# Patient Record
Sex: Female | Born: 1985 | Race: White | Hispanic: No | Marital: Single | Smoking: Former smoker
Health system: Southern US, Community
[De-identification: ages and names within clinical notes are randomized; demographics above are authoritative.]

## PROBLEM LIST (undated history)

## (undated) ENCOUNTER — Ambulatory Visit: Admission: EM | Payer: Self-pay | Source: Home / Self Care

## (undated) DIAGNOSIS — B003 Herpesviral meningitis: Secondary | ICD-10-CM

## (undated) DIAGNOSIS — I10 Essential (primary) hypertension: Secondary | ICD-10-CM

## (undated) DIAGNOSIS — G032 Benign recurrent meningitis [Mollaret]: Secondary | ICD-10-CM

## (undated) DIAGNOSIS — G039 Meningitis, unspecified: Secondary | ICD-10-CM

## (undated) HISTORY — PX: CHOLECYSTECTOMY: SHX55

## (undated) HISTORY — DX: Benign recurrent meningitis (mollaret): G03.2

---
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
## 2005-12-19 ENCOUNTER — Emergency Department: Payer: Self-pay

## 2006-01-02 ENCOUNTER — Emergency Department: Payer: Self-pay | Admitting: Emergency Medicine

## 2006-08-12 ENCOUNTER — Emergency Department: Payer: Self-pay | Admitting: Emergency Medicine

## 2006-08-30 ENCOUNTER — Emergency Department: Payer: Self-pay | Admitting: Unknown Physician Specialty

## 2007-03-30 ENCOUNTER — Emergency Department: Payer: Self-pay | Admitting: Unknown Physician Specialty

## 2007-07-23 ENCOUNTER — Emergency Department: Payer: Self-pay | Admitting: Emergency Medicine

## 2009-02-17 ENCOUNTER — Emergency Department: Payer: Self-pay | Admitting: Emergency Medicine

## 2009-03-04 ENCOUNTER — Emergency Department: Payer: Self-pay | Admitting: Internal Medicine

## 2010-02-26 ENCOUNTER — Emergency Department: Payer: Self-pay | Admitting: Emergency Medicine

## 2010-02-28 ENCOUNTER — Inpatient Hospital Stay: Payer: Self-pay | Admitting: Internal Medicine

## 2010-03-20 ENCOUNTER — Emergency Department: Payer: Self-pay | Admitting: Emergency Medicine

## 2010-05-21 ENCOUNTER — Emergency Department: Payer: Self-pay | Admitting: Emergency Medicine

## 2010-07-03 ENCOUNTER — Emergency Department: Payer: Self-pay | Admitting: Emergency Medicine

## 2011-07-16 IMAGING — CR LEFT WRIST - COMPLETE 3+ VIEW
1 series · 4 of 4 positions shown · non-contrast
Comparison: none

REASON FOR EXAM: injury/pain/swelling..pt in WR
COMMENTS:   May transport without cardiac monitor

PROCEDURE:     DXR - DXR WRIST LT COMP WITH OBLIQUES  - May 22, 2010  [DATE]
RESULT:     Four views of the left wrist are submitted.
The bones appear adequately mineralized. I do not see evidence of an acute
fracture. The overlying soft tissues are grossly normal.

[Series 1: view not recorded · 0.17mm/px · 4 of 4 slices shown]
[im 1/4]
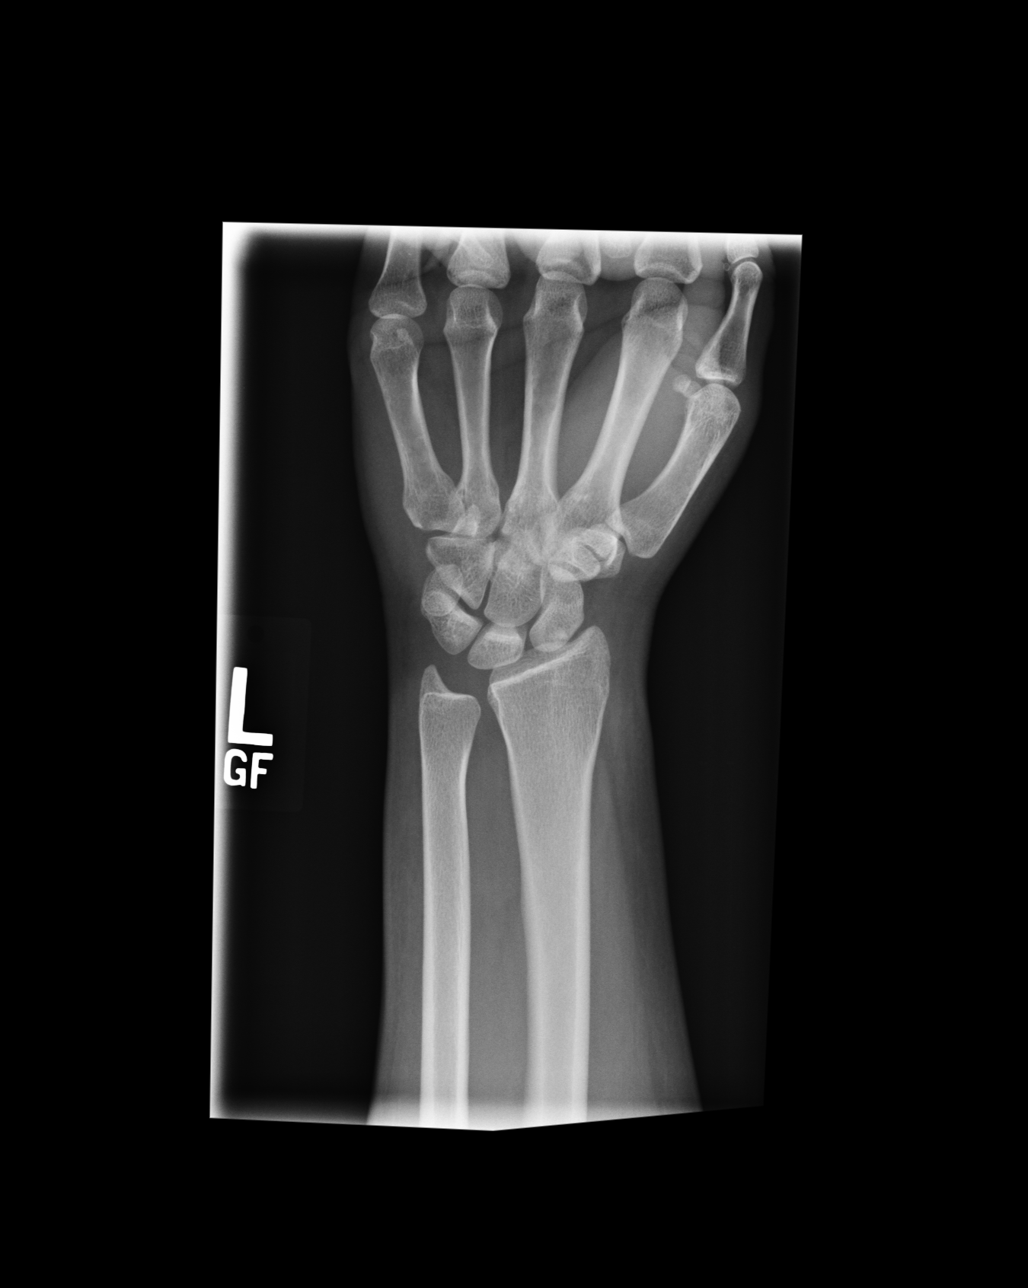
[im 2/4]
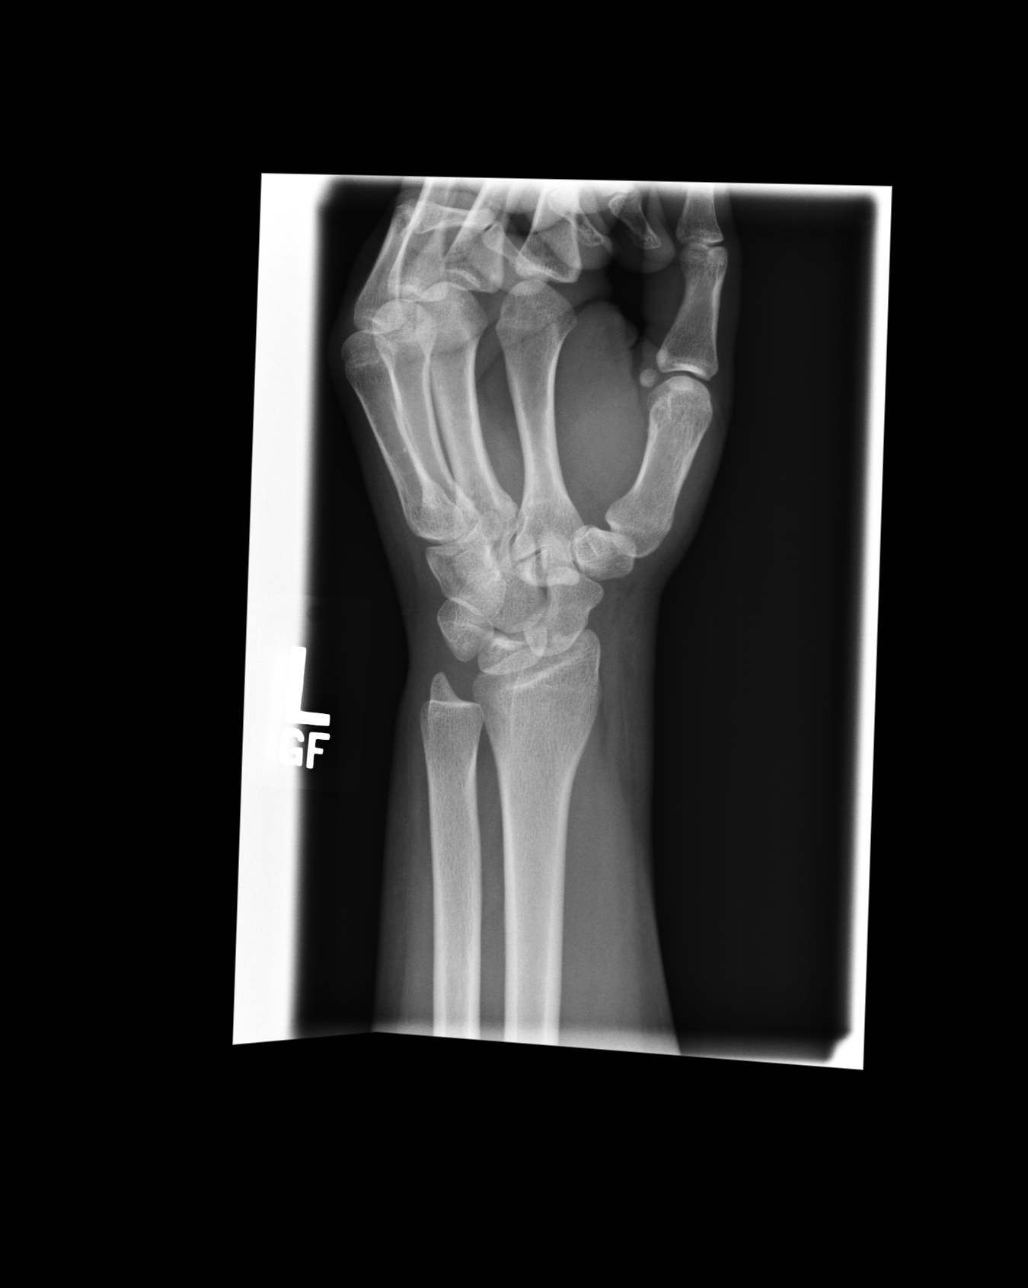
[im 3/4]
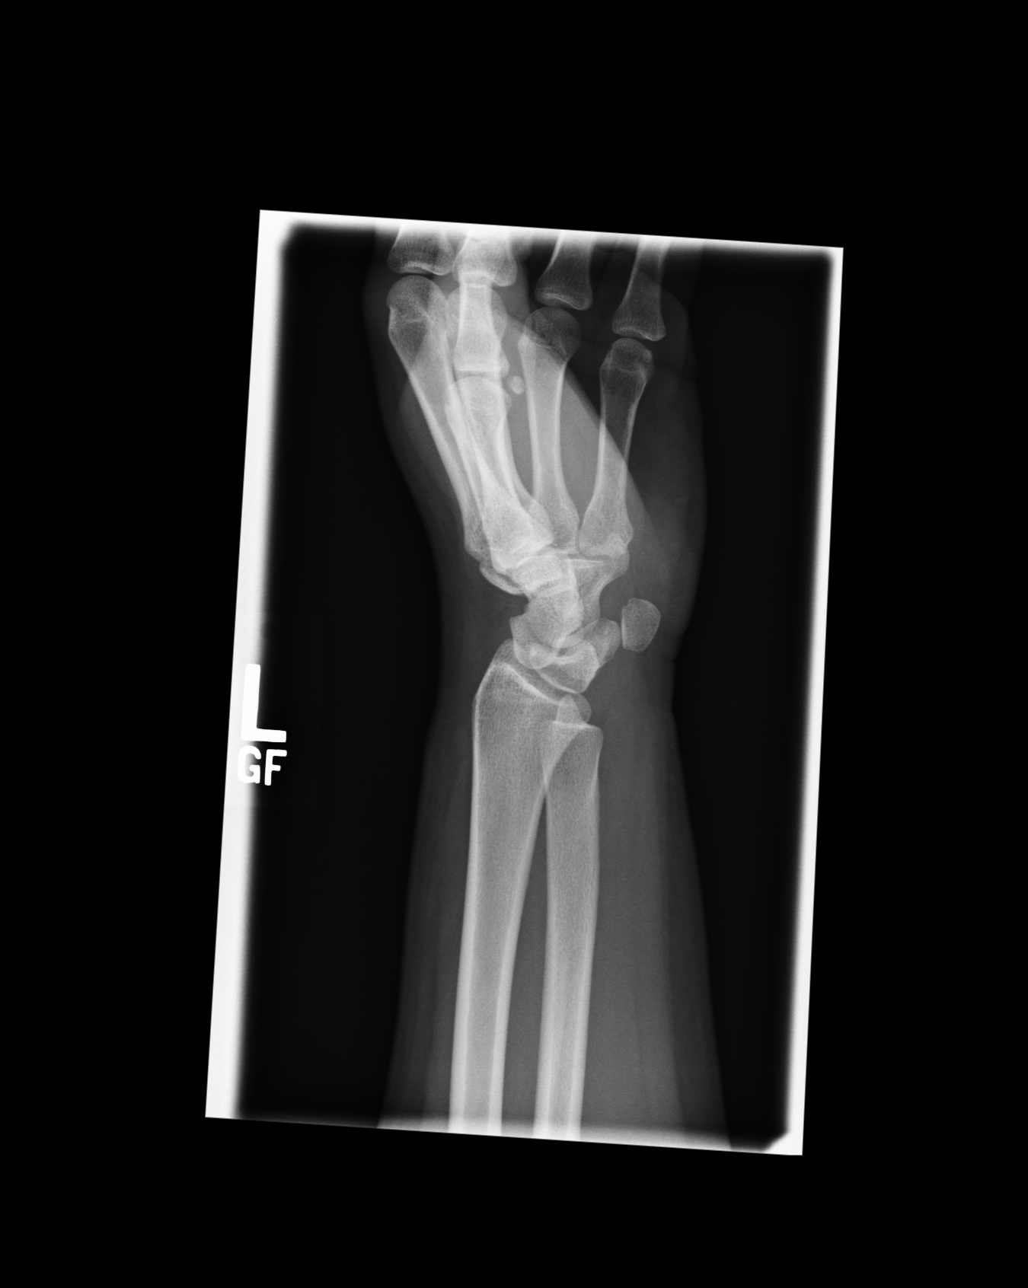
[im 4/4]
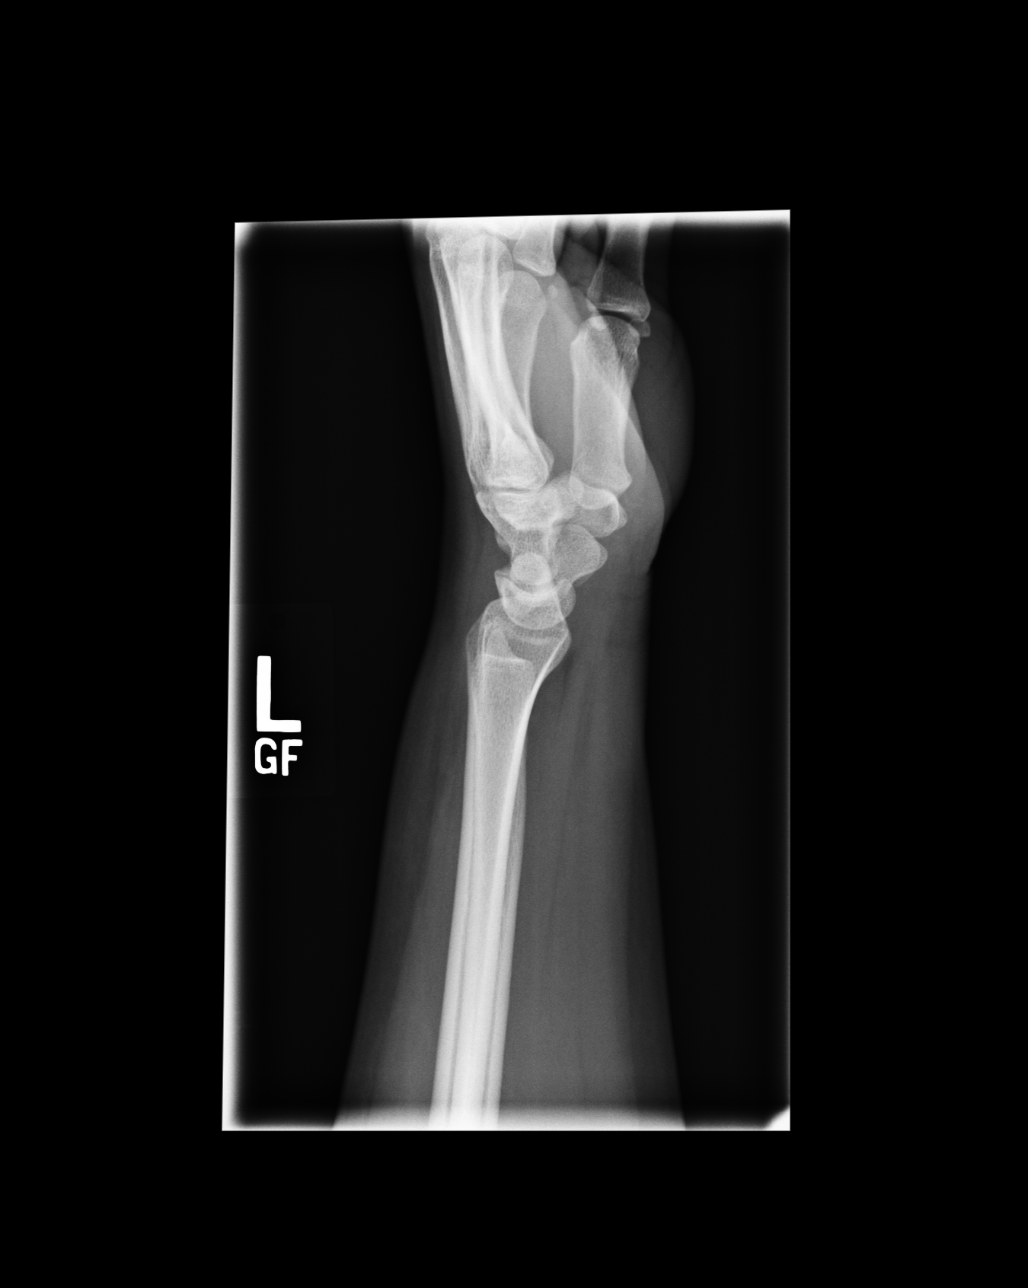

[4 of 4 positions shown; findings below may reference images not displayed]

IMPRESSION: I see no acute bony abnormality of the left wrist.

## 2015-04-21 DIAGNOSIS — R519 Headache, unspecified: Secondary | ICD-10-CM | POA: Insufficient documentation

## 2015-04-22 DIAGNOSIS — R112 Nausea with vomiting, unspecified: Secondary | ICD-10-CM | POA: Insufficient documentation

## 2015-04-22 DIAGNOSIS — B003 Herpesviral meningitis: Secondary | ICD-10-CM | POA: Insufficient documentation

## 2015-07-01 ENCOUNTER — Ambulatory Visit: Payer: Self-pay | Admitting: Physician Assistant

## 2015-07-01 ENCOUNTER — Encounter: Payer: Self-pay | Admitting: Physician Assistant

## 2015-07-01 VITALS — BP 110/82 | HR 100 | Temp 101.0°F

## 2015-07-01 DIAGNOSIS — R110 Nausea: Secondary | ICD-10-CM

## 2015-07-01 DIAGNOSIS — R1110 Vomiting, unspecified: Secondary | ICD-10-CM

## 2015-07-01 DIAGNOSIS — R509 Fever, unspecified: Secondary | ICD-10-CM

## 2015-07-01 LAB — POCT INFLUENZA A/B
INFLUENZA A, POC: NEGATIVE
Influenza B, POC: NEGATIVE

## 2015-07-01 LAB — POCT URINE PREGNANCY: Preg Test, Ur: NEGATIVE

## 2015-07-01 LAB — GLUCOSE, POCT (MANUAL RESULT ENTRY): POC GLUCOSE: 115 mg/dL — AB (ref 70–99)

## 2015-07-01 MED ORDER — PROMETHAZINE HCL 25 MG PO TABS: 25.0000 mg | ORAL_TABLET | Freq: Three times a day (TID) | ORAL | Status: DC | PRN

## 2015-07-01 NOTE — Progress Notes (Signed)
S: at work this morning, got dizzy, felt weak, slid down the wall and ?passed out, did have vomiting this am and thinks she has a fever, fsbs 115, doesn't think she's preg, her coworker states she was "out" for a few seconds, pt doesn't think she passed out, is feeling a little better now but thinks her temp has gotten higher  O: vitals wnl except temp elevated at 99, on recheck it is 101;  nad, perrl eomi, ENT wnl, neck supple no lymph, lungs c t a, cv rrr, neuro intact, ua preg neg, flu swab neg, fsbs 115  A: acute viral illness  P: phenergan, fluids, no work until has not had a fever in 24-48 hours; if worsening return to clinic or go to ER

## 2015-07-01 NOTE — Progress Notes (Signed)
Glucose test Lot # [id number] Exp 09/2016 and  HCG test lot # [id number] exp 09/2016  And Influenza test [id number]# [id number]  Exp 07/2016

## 2015-07-01 NOTE — Addendum Note (Signed)
Addended by: Versie Starks on: 07/01/2015 11:21 AM   Modules accepted: Orders

## 2015-07-01 NOTE — Patient Instructions (Signed)
Nausea and Vomiting  Nausea means you feel sick to your stomach. Throwing up (vomiting) is a reflex where stomach contents come out of your mouth.  HOME CARE   · Take medicine as told by your doctor.  · Do not force yourself to eat. However, you do need to drink fluids.  · If you feel like eating, eat a normal diet as told by your doctor.    Eat rice, wheat, potatoes, bread, lean meats, yogurt, fruits, and vegetables.    Avoid high-fat foods.  · Drink enough fluids to keep your pee (urine) clear or pale yellow.  · Ask your doctor how to replace body fluid losses (rehydrate). Signs of body fluid loss (dehydration) include:    Feeling very thirsty.    Dry lips and mouth.    Feeling dizzy.    Dark pee.    Peeing less than normal.    Feeling confused.    Fast breathing or heart rate.  GET HELP RIGHT AWAY IF:   · You have blood in your throw up.  · You have black or bloody poop (stool).  · You have a bad headache or stiff neck.  · You feel confused.  · You have bad belly (abdominal) pain.  · You have chest pain or trouble breathing.  · You do not pee at least once every 8 hours.  · You have cold, clammy skin.  · You keep throwing up after 24 to 48 hours.  · You have a fever.  MAKE SURE YOU:   · Understand these instructions.  · Will watch your condition.  · Will get help right away if you are not doing well or get worse.     This information is not intended to replace advice given to you by your health care provider. Make sure you discuss any questions you have with your health care provider.     Document Released: 11/08/2007 Document Revised: 08/14/2011 Document Reviewed: 10/21/2010  Elsevier Interactive Patient Education ©2016 [employer]

## 2015-12-28 DIAGNOSIS — F331 Major depressive disorder, recurrent, moderate: Secondary | ICD-10-CM | POA: Diagnosis not present

## 2015-12-28 DIAGNOSIS — Z23 Encounter for immunization: Secondary | ICD-10-CM | POA: Diagnosis not present

## 2015-12-28 DIAGNOSIS — Z131 Encounter for screening for diabetes mellitus: Secondary | ICD-10-CM | POA: Diagnosis not present

## 2015-12-28 DIAGNOSIS — L404 Guttate psoriasis: Secondary | ICD-10-CM | POA: Insufficient documentation

## 2015-12-28 DIAGNOSIS — Z8759 Personal history of other complications of pregnancy, childbirth and the puerperium: Secondary | ICD-10-CM | POA: Insufficient documentation

## 2015-12-28 DIAGNOSIS — D500 Iron deficiency anemia secondary to blood loss (chronic): Secondary | ICD-10-CM | POA: Diagnosis not present

## 2015-12-28 DIAGNOSIS — B004 Herpesviral encephalitis: Secondary | ICD-10-CM | POA: Insufficient documentation

## 2015-12-28 DIAGNOSIS — R739 Hyperglycemia, unspecified: Secondary | ICD-10-CM | POA: Diagnosis not present

## 2016-01-18 DIAGNOSIS — F331 Major depressive disorder, recurrent, moderate: Secondary | ICD-10-CM | POA: Insufficient documentation

## 2016-01-18 DIAGNOSIS — R739 Hyperglycemia, unspecified: Secondary | ICD-10-CM | POA: Insufficient documentation

## 2016-02-10 DIAGNOSIS — F331 Major depressive disorder, recurrent, moderate: Secondary | ICD-10-CM | POA: Diagnosis not present

## 2016-02-10 DIAGNOSIS — F17210 Nicotine dependence, cigarettes, uncomplicated: Secondary | ICD-10-CM | POA: Diagnosis not present

## 2016-04-06 DIAGNOSIS — H52223 Regular astigmatism, bilateral: Secondary | ICD-10-CM | POA: Diagnosis not present

## 2016-04-06 DIAGNOSIS — Z0100 Encounter for examination of eyes and vision without abnormal findings: Secondary | ICD-10-CM | POA: Diagnosis not present

## 2016-05-08 ENCOUNTER — Ambulatory Visit: Payer: Self-pay | Admitting: Physician Assistant

## 2016-05-08 VITALS — BP 136/98 | HR 80 | Temp 98.2°F

## 2016-05-08 DIAGNOSIS — J029 Acute pharyngitis, unspecified: Secondary | ICD-10-CM

## 2016-05-08 LAB — POCT RAPID STREP A (OFFICE): Rapid Strep A Screen: NEGATIVE

## 2016-05-08 MED ORDER — FLUCONAZOLE 150 MG PO TABS: 150.0000 mg | ORAL_TABLET | Freq: Once | ORAL | 0 refills | Status: AC

## 2016-05-08 MED ORDER — AMOXICILLIN 875 MG PO TABS: 875.0000 mg | ORAL_TABLET | Freq: Two times a day (BID) | ORAL | 0 refills | Status: DC

## 2016-05-08 NOTE — Progress Notes (Signed)
S: C/o runny nose and congestion with dry cough for 3 days, sore throat,  + fever, chills, denies cp/sob, v/d; mucus was green this am but clear throughout the day, cough is sporadic,   Using otc meds: cold meds  O: PE: vitals wnl, nad,  perrl eomi, normocephalic, tms dull, nasal mucosa red and swollen, throat injected, neck supple no lymph, lungs c t a, cv rrr, neuro intact, q strep   A:  Acute pharyngitis   P: drink fluids, continue regular meds , use otc meds of choice, return if not improving in 5 days, return earlier if worsening, amoxil, diflucan

## 2017-06-03 ENCOUNTER — Encounter: Payer: Self-pay | Admitting: *Deleted

## 2017-06-03 ENCOUNTER — Ambulatory Visit
Admission: EM | Admit: 2017-06-03 | Discharge: 2017-06-03 | Disposition: A | Discharge status: Home / Self Care | Payer: 59 | Attending: Family Medicine | Admitting: Family Medicine

## 2017-06-03 ENCOUNTER — Other Ambulatory Visit: Payer: Self-pay

## 2017-06-03 DIAGNOSIS — M5441 Lumbago with sciatica, right side: Secondary | ICD-10-CM | POA: Diagnosis not present

## 2017-06-03 DIAGNOSIS — N390 Urinary tract infection, site not specified: Secondary | ICD-10-CM

## 2017-06-03 LAB — URINALYSIS, COMPLETE (UACMP) WITH MICROSCOPIC
Bilirubin Urine: NEGATIVE
GLUCOSE, UA: NEGATIVE mg/dL
HGB URINE DIPSTICK: NEGATIVE
KETONES UR: NEGATIVE mg/dL
NITRITE: POSITIVE — AB
PROTEIN: NEGATIVE mg/dL
RBC / HPF: NONE SEEN RBC/hpf (ref 0–5)
Specific Gravity, Urine: 1.025 (ref 1.005–1.030)
pH: 5.5 (ref 5.0–8.0)

## 2017-06-03 MED ORDER — KETOROLAC TROMETHAMINE 60 MG/2ML IM SOLN
60.0000 mg | Freq: Once | INTRAMUSCULAR | Status: AC
  Administered 2017-06-03: 60 mg via INTRAMUSCULAR

## 2017-06-03 MED ORDER — CYCLOBENZAPRINE HCL 10 MG PO TABS: 10.0000 mg | ORAL_TABLET | Freq: Two times a day (BID) | ORAL | 0 refills | Status: DC | PRN

## 2017-06-03 MED ORDER — CEPHALEXIN 500 MG PO CAPS: 500.0000 mg | ORAL_CAPSULE | Freq: Two times a day (BID) | ORAL | 0 refills | Status: AC

## 2017-06-03 MED ORDER — MELOXICAM 15 MG PO TABS: 15.0000 mg | ORAL_TABLET | Freq: Every day | ORAL | 0 refills | Status: DC | PRN

## 2017-06-03 NOTE — ED Triage Notes (Signed)
Patient started having lower back pain 2 weeks ago that has become worse in the last 3 days. Mechanism of injury unknown. No urinary issues reported. Patient reports vomiting with pain 2 days ago.
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
## 2017-06-03 NOTE — Discharge Instructions (Signed)
Take medication as prescribed. Rest. Drink plenty of fluids. Stretch.   Follow up with your primary care physician or orthopedic this week as needed. Return to Urgent care for new or worsening concerns.

## 2017-06-05 LAB — URINE CULTURE

## 2017-11-05 ENCOUNTER — Ambulatory Visit
Admission: EM | Admit: 2017-11-05 | Discharge: 2017-11-05 | Disposition: A | Discharge status: Home / Self Care | Payer: 59 | Attending: Family Medicine | Admitting: Family Medicine

## 2017-11-05 ENCOUNTER — Encounter: Payer: Self-pay | Admitting: Emergency Medicine

## 2017-11-05 ENCOUNTER — Other Ambulatory Visit: Payer: Self-pay

## 2017-11-05 DIAGNOSIS — H538 Other visual disturbances: Secondary | ICD-10-CM

## 2017-11-05 DIAGNOSIS — I10 Essential (primary) hypertension: Secondary | ICD-10-CM | POA: Diagnosis not present

## 2017-11-05 HISTORY — DX: Essential (primary) hypertension: I10

## 2017-11-05 LAB — BASIC METABOLIC PANEL
ANION GAP: 12 (ref 5–15)
BUN: 11 mg/dL (ref 6–20)
CALCIUM: 9.3 mg/dL (ref 8.9–10.3)
CO2: 23 mmol/L (ref 22–32)
Chloride: 104 mmol/L (ref 101–111)
Creatinine, Ser: 0.68 mg/dL (ref 0.44–1.00)
GFR calc Af Amer: >60 mL/min (ref >60)
GFR calc non Af Amer: >60 mL/min (ref >60)
GLUCOSE: 86 mg/dL (ref 65–99)
Potassium: 3.9 mmol/L (ref 3.5–5.1)
Sodium: 139 mmol/L (ref 135–145)

## 2017-11-05 MED ORDER — LISINOPRIL 20 MG PO TABS: 20.0000 mg | ORAL_TABLET | Freq: Every day | ORAL | 0 refills | Status: DC

## 2017-11-05 NOTE — ED Provider Notes (Signed)
[facility]    CSN: [id number] Arrival date & time: 11/05/17  1357     History   Chief Complaint Chief Complaint  Patient presents with  . Hypertension    HPI Penny Houston is a 32 y.o. female.   33 yo female with c/o high blood pressure and episode today at work when she had sudden blurred vision. States last several minutes and resolved. Currently not having any symptoms. Denies chest pains, numbness/tingling, weakness, vision changes, speech or swallowing problems. States she had been diagnosed with hypertension in the past and was taking medication (HCTZ) but stopped it on her own due to side effects.   The history is provided by the patient.  Hypertension     Past Medical History:  Diagnosis Date  . Hypertension     There are no active problems to display for this patient.   Past Surgical History:  Procedure Laterality Date  . CESAREAN SECTION    . CHOLECYSTECTOMY      OB History   None      Home Medications    Prior to Admission medications   Medication Sig Start Date End Date Taking? Authorizing Provider  buPROPion (WELLBUTRIN XL) 150 MG 24 hr tablet Take by mouth. 02/10/16 02/09/17  [provider]  cyclobenzaprine (FLEXERIL) 10 MG tablet Take 1 tablet (10 mg total) by mouth 2 (two) times daily as needed for muscle spasms. Do not drive while taking as can cause drowsiness 06/03/17   Marylene Land, NP  lisinopril (PRINIVIL,ZESTRIL) 20 MG tablet Take 1 tablet (20 mg total) by mouth daily. 11/05/17   Norval Gable, MD  meloxicam (MOBIC) 15 MG tablet Take 1 tablet (15 mg total) by mouth daily as needed. 06/03/17   Marylene Land, NP    Family History History reviewed. No pertinent family history.  Social History Social History   Tobacco Use  . Smoking status: Current Every Day Smoker    Packs/day: 0.50    Types: Cigarettes  . Smokeless tobacco: Never Used  Substance Use Topics  . Alcohol use: No    Alcohol/week: 0.0 oz   Frequency: Never  . Drug use: No     Allergies   Patient has no known allergies.   Review of Systems Review of Systems   Physical Exam Triage Vital Signs ED Triage Vitals  Enc Vitals Group     BP 11/05/17 1419 (!) 148/96     Pulse Rate 11/05/17 1419 73     Resp 11/05/17 1419 16     Temp 11/05/17 1419 98.5 F (36.9 C)     Temp Source 11/05/17 1419 Oral     SpO2 11/05/17 1419 98 %     Weight 11/05/17 1416 260 lb (117.9 kg)     Height 11/05/17 1416 5\' 10"  (1.778 m)     Head Circumference --      Peak Flow --      Pain Score 11/05/17 1416 0     Pain Loc --      Pain Edu? --      Excl. in [place]? --    No data found.  Updated Vital Signs BP (!) 148/96 (BP Location: Left Arm)   Pulse 73   Temp 98.5 F (36.9 C) (Oral)   Resp 16   Ht 5\' 10"  (1.778 m)   Wt 260 lb (117.9 kg)   SpO2 98%   BMI 37.31 kg/m   Visual Acuity Right Eye Distance:   Left Eye Distance:  Bilateral Distance:    Right Eye Near:   Left Eye Near:    Bilateral Near:     Physical Exam  Constitutional: She is oriented to person, place, and time. She appears well-developed and well-nourished. No distress.  HENT:  Head: Normocephalic.  Nose: Nose normal.  Mouth/Throat: Oropharynx is clear and moist and mucous membranes are normal.  Eyes: Pupils are equal, round, and reactive to light. Conjunctivae and EOM are normal. Right eye exhibits no discharge. Left eye exhibits no discharge. No scleral icterus.  Neck: Normal range of motion. Neck supple. No JVD present. No tracheal deviation present. No thyromegaly present.  Cardiovascular: Normal rate, regular rhythm, normal heart sounds and intact distal pulses.  No murmur heard. Pulmonary/Chest: Effort normal and breath sounds normal. No stridor. No respiratory distress. She has no wheezes. She has no rales. She exhibits no tenderness.  Musculoskeletal: She exhibits no edema or tenderness.  Lymphadenopathy:    She has no cervical adenopathy.    Neurological: She is alert and oriented to person, place, and time. She has normal reflexes. She displays normal reflexes. No cranial nerve deficit or sensory deficit. She exhibits normal muscle tone. Coordination normal.  Skin: Skin is warm and dry. No rash noted. She is not diaphoretic. No erythema. No pallor.  Psychiatric: She has a normal mood and affect. Her behavior is normal. Judgment and thought content normal.  Vitals reviewed.    UC Treatments / Results  Labs (all labs ordered are listed, but only abnormal results are displayed) Labs Reviewed  BASIC METABOLIC PANEL    EKG None  Radiology No results found.  Procedures Procedures (including critical care time)  Medications Ordered in UC Medications - No data to display  Initial Impression / Assessment and Plan / UC Course  I have reviewed the triage vital signs and the nursing notes.  Pertinent labs & imaging results that were available during my care of the patient were reviewed by me and considered in my medical decision making (see chart for details).      Final Clinical Impressions(s) / UC Diagnoses   Final diagnoses:  Essential hypertension   Discharge Instructions   None    ED Prescriptions    Medication Sig Dispense Auth. Provider   lisinopril (PRINIVIL,ZESTRIL) 20 MG tablet Take 1 tablet (20 mg total) by mouth daily. 15 tablet Norval Gable, MD     1. Lab results and diagnosis reviewed with patient 2. rx as per orders above; reviewed possible side effects, interactions, risks and benefits  3.Follow-up with PCP   Controlled Substance Prescriptions [place] Controlled Substance Registry consulted? Not Applicable   Norval Gable, MD 11/05/17 [phone]

## 2017-11-05 NOTE — ED Triage Notes (Signed)
Patient states that she was at work today and she checked her blood pressure and it was 165/102.  Patient states that she has history of HTN but has not been on blood pressure medicine in 3 years.

## 2017-11-15 DIAGNOSIS — I10 Essential (primary) hypertension: Secondary | ICD-10-CM | POA: Diagnosis not present

## 2017-11-15 DIAGNOSIS — R0683 Snoring: Secondary | ICD-10-CM | POA: Diagnosis not present

## 2017-11-15 DIAGNOSIS — E669 Obesity, unspecified: Secondary | ICD-10-CM | POA: Diagnosis not present

## 2018-09-12 DIAGNOSIS — F418 Other specified anxiety disorders: Secondary | ICD-10-CM | POA: Diagnosis not present

## 2018-12-09 DIAGNOSIS — L723 Sebaceous cyst: Secondary | ICD-10-CM | POA: Diagnosis not present

## 2018-12-09 DIAGNOSIS — Z135 Encounter for screening for eye and ear disorders: Secondary | ICD-10-CM | POA: Diagnosis not present

## 2018-12-09 DIAGNOSIS — H5213 Myopia, bilateral: Secondary | ICD-10-CM | POA: Diagnosis not present

## 2018-12-27 DIAGNOSIS — D485 Neoplasm of uncertain behavior of skin: Secondary | ICD-10-CM | POA: Diagnosis not present

## 2019-01-24 DIAGNOSIS — E669 Obesity, unspecified: Secondary | ICD-10-CM | POA: Diagnosis not present

## 2019-01-24 DIAGNOSIS — H53149 Visual discomfort, unspecified: Secondary | ICD-10-CM | POA: Diagnosis not present

## 2019-01-24 DIAGNOSIS — Z6837 Body mass index (BMI) 37.0-37.9, adult: Secondary | ICD-10-CM | POA: Diagnosis not present

## 2019-01-24 DIAGNOSIS — Z20828 Contact with and (suspected) exposure to other viral communicable diseases: Secondary | ICD-10-CM | POA: Diagnosis not present

## 2019-01-24 DIAGNOSIS — G039 Meningitis, unspecified: Secondary | ICD-10-CM

## 2019-01-24 DIAGNOSIS — G032 Benign recurrent meningitis [Mollaret]: Secondary | ICD-10-CM | POA: Diagnosis not present

## 2019-01-24 DIAGNOSIS — Z1159 Encounter for screening for other viral diseases: Secondary | ICD-10-CM | POA: Diagnosis not present

## 2019-01-24 DIAGNOSIS — B0089 Other herpesviral infection: Secondary | ICD-10-CM | POA: Diagnosis not present

## 2019-01-24 DIAGNOSIS — I10 Essential (primary) hypertension: Secondary | ICD-10-CM | POA: Diagnosis not present

## 2019-01-24 DIAGNOSIS — B003 Herpesviral meningitis: Secondary | ICD-10-CM | POA: Diagnosis not present

## 2019-01-24 DIAGNOSIS — R51 Headache: Secondary | ICD-10-CM | POA: Diagnosis not present

## 2019-01-24 DIAGNOSIS — Z87891 Personal history of nicotine dependence: Secondary | ICD-10-CM | POA: Diagnosis not present

## 2019-01-24 HISTORY — DX: Herpesviral meningitis: B00.3

## 2019-01-24 HISTORY — DX: Meningitis, unspecified: G03.9

## 2019-01-26 DIAGNOSIS — G032 Benign recurrent meningitis [Mollaret]: Secondary | ICD-10-CM | POA: Insufficient documentation

## 2019-01-28 ENCOUNTER — Other Ambulatory Visit: Payer: Self-pay | Admitting: *Deleted

## 2019-01-28 ENCOUNTER — Encounter: Payer: Self-pay | Admitting: *Deleted

## 2019-01-28 MED ORDER — Medication
10.00 | Status: DC
Start: 2019-01-26 — End: 2019-01-28

## 2019-01-28 MED ORDER — COMPOUND W FREEZE OFF EX AERO
0.00 | INHALATION_SPRAY | CUTANEOUS | Status: DC
Start: 2019-01-26 — End: 2019-01-28

## 2019-01-28 MED ORDER — QUINERVA 260 MG PO TABS
650.00 | ORAL_TABLET | ORAL | Status: DC
Start: ? — End: 2019-01-28

## 2019-01-28 MED ORDER — OLANZAPINE-FLUOXETINE HCL 6-50 MG PO CAPS
3.00 | ORAL_CAPSULE | ORAL | Status: DC
Start: ? — End: 2019-01-28

## 2019-01-28 MED ORDER — Medication
5.00 | Status: DC
Start: ? — End: 2019-01-28

## 2019-01-28 MED ORDER — GENERIC EXTERNAL MEDICATION
Status: DC
Start: ? — End: 2019-01-28

## 2019-01-28 MED ORDER — BURN RELIEF ALOE EX
200.00 | CUTANEOUS | Status: DC
Start: ? — End: 2019-01-28

## 2019-01-28 MED ORDER — BENICAR 20 MG PO TABS
12.50 | ORAL_TABLET | ORAL | Status: DC
Start: ? — End: 2019-01-28

## 2019-01-28 MED ORDER — POLYETHYLENE GLYCOL 3350 17 G PO PACK
17.00 | PACK | ORAL | Status: DC
Start: 2019-01-26 — End: 2019-01-28

## 2019-01-28 MED ORDER — LACTATED RINGERS IV SOLN
100.00 | INTRAVENOUS | Status: DC
Start: ? — End: 2019-01-28

## 2019-01-28 NOTE — Patient Outreach (Signed)
[facility]) Care Management  01/28/2019  Penny Houston 1985/12/06 [id number]   Transition of care call/case closure   Referral received: 01/28/19 Initial outreach: 01/28/19 Insurance: [employer]   Subjective: Initial successful telephone call to patient's home/mobile number in order to complete transition of care assessment; 2 HIPAA identifiers verified. Explained purpose of call and completed transition of care assessment.  Penny Houston states she still feels weak, nauseated and dizzy but her headache is much better. She says this is the 3rd time she has had meningitis.  She attributes the dizziness to the Valtrex.  She says she does have the hospital indemnity She says she uses the [facility] outpatient pharmacy at [facility]..  She denies educational needs related to staying safe during the COVID 19 pandemic.    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 Comorbidities include: recurrent meningitis, HTN, psoriasis, depression, obesity and tobacco use She was discharged to home on 01/26/19 without the need for home health services or DME.   Assessment:  Patient voices good understanding of all discharge instructions.  See transition of care flowsheet for assessment details.   Plan:  With Penny Houston's permission, emailed the [employer] contact number to her [facility] e-mail address to assist her in filing her hospital indemnity claim, also e-mailed the [place] benefits related to smoking cessation.  Reviewed hospital discharge diagnosis of herpes simplex meningitis and treatment plan using hospital discharge instructions, assessing medication adherence, reviewing problems requiring provider notification, and discussing the importance of follow up with her primary care provider. Reviewed [employer]'s announcements that all [place] members will receive the Healthy Lifestyle Premium rate in 2021 and that the Health Rewards Physical Activity Program has been temporarily suspended. No ongoing care management needs identified so will close case to [facility] [employer] services and route successful outreach letter with [place] Management pamphlet and 24 Hour Nurse Line Magnet to [facility] [employer] clinical pool to be mailed to patient's home address.   Barrington Ellison RN,CCM,CDE [facility] Coordinator Office Phone [phone] Office Fax [phone]

## 2019-04-07 ENCOUNTER — Ambulatory Visit
Admission: EM | Admit: 2019-04-07 | Discharge: 2019-04-07 | Disposition: A | Discharge status: Home / Self Care | Payer: 59 | Attending: Family Medicine | Admitting: Family Medicine

## 2019-04-07 ENCOUNTER — Encounter: Payer: Self-pay | Admitting: Emergency Medicine

## 2019-04-07 ENCOUNTER — Other Ambulatory Visit: Payer: Self-pay

## 2019-04-07 DIAGNOSIS — L0100 Impetigo, unspecified: Secondary | ICD-10-CM

## 2019-04-07 DIAGNOSIS — R21 Rash and other nonspecific skin eruption: Secondary | ICD-10-CM | POA: Diagnosis not present

## 2019-04-07 MED ORDER — MUPIROCIN 2 % EX OINT: TOPICAL_OINTMENT | CUTANEOUS | 0 refills | Status: DC

## 2019-04-07 MED ORDER — SULFAMETHOXAZOLE-TRIMETHOPRIM 800-160 MG PO TABS: 1.0000 | ORAL_TABLET | Freq: Two times a day (BID) | ORAL | 0 refills | Status: AC

## 2019-04-07 NOTE — Discharge Instructions (Addendum)
Take medication as prescribed. Keep clean. Monitor.  ° °Follow up with your primary care physician this week as needed. Return to Urgent care for new or worsening concerns.  ° °

## 2019-04-07 NOTE — ED Triage Notes (Signed)
Patient in today c/o rash on her face and left shoulder x 2-3 days. Patient has tried Hydrocortisone without relief.

## 2019-04-07 NOTE — ED Provider Notes (Signed)
[facility] ____________________________________________  Time seen: Approximately 7:29 PM  I have reviewed the triage vital signs and the nursing notes.   HISTORY  Chief Complaint Rash   HPI Penny Houston is a 33 y.o. female present for evaluation of rash present to the tip of her nose, right chin and left shoulder present for the last few days.  States initially it was on her nose and then spread to her chin and now up to the shoulder.  States the areas are not really itchy but somewhat tender.  States has some yellow we using.  Denies injury, known insect bite or trigger.  Does wear a mask that rubs against the area aggravating it but unsure what caused.  Denies known MRSA.  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  Denies other skin changes.  Denies fevers, chest pain, shortness of breath or recent sickness.  Reports otherwise doing well.  Denies recent antibiotic use.  Denies pregnancy.  Hortencia Pilar, MD: PCP   Past Medical History:  Diagnosis Date  . Hypertension   . Meningitis 01/24/2019  . 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 Mollaret's syndrome (benign recurrent meningitis)     Patient Active Problem List   Diagnosis Date Noted  . Mollaret's meningitis 01/26/2019  . Meningitis 01/24/2019  . Obesity 01/24/2019  . Essential hypertension 11/15/2017  . Hyperglycemia 01/18/2016  . Moderate episode of recurrent major depressive disorder ([place]) 01/18/2016  . 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 History of pre-eclampsia 12/28/2015  . 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 Nausea and vomiting 04/22/2015  . Acute headache 04/21/2015    Past Surgical History:  Procedure Laterality Date  . CESAREAN SECTION    . CHOLECYSTECTOMY       No current facility-administered medications for this encounter.   Current Outpatient Medications:  .  losartan (COZAAR) 25 MG tablet,  Take by mouth., Disp: , Rfl:  .  mupirocin ointment (BACTROBAN) 2 %, Apply two times a day for 7 days., Disp: 22 g, Rfl: 0 .  sulfamethoxazole-trimethoprim (BACTRIM DS) 800-160 MG tablet, Take 1 tablet by mouth 2 (two) times daily for 10 days., Disp: 20 tablet, Rfl: 0  Allergies Patient has no known allergies.  Family History  Problem Relation Age of Onset  . Rheum arthritis Mother   . Hypertension Mother   . Hypertension Father   . Hyperlipidemia Father   . Stroke Father   . Heart attack Father 39    Social History Social History   Tobacco Use  . Smoking status: Former Smoker    Packs/day: 0.50    Types: Cigarettes    Quit date: 10/2017    Years since quitting: 1.5  . Smokeless tobacco: Never Used  Substance Use Topics  . Alcohol use: No    Alcohol/week: 0.0 standard drinks    Frequency: Never  . Drug use: No    Review of Systems Constitutional: No fever ENT: No sore throat. Cardiovascular: Denies chest pain. Respiratory: Denies shortness of breath. Gastrointestinal: No abdominal pain.  Musculoskeletal: Negative for back pain. Skin: Positive for rash.  ____________________________________________   PHYSICAL EXAM:  VITAL SIGNS: ED Triage Vitals  Enc Vitals Group     BP 04/07/19 1819 (!) 150/99     Pulse Rate 04/07/19 1819 80     Resp 04/07/19 1819 16     Temp 04/07/19 1819 98 F (36.7 C)     Temp  Source 04/07/19 1819 Oral     SpO2 04/07/19 1819 100 %     Weight 04/07/19 1818 241 lb (109.3 kg)     Height 04/07/19 1818 5\' 9"  (1.753 m)     Head Circumference --      Peak Flow --      Pain Score 04/07/19 1818 0     Pain Loc --      Pain Edu? --      Excl. in [place]? --     Constitutional: Alert and oriented. Well appearing and in no acute distress. Eyes: Conjunctivae are normal.  ENT      Head: Normocephalic and atraumatic.      Nose: No congestion.  Bilateral nares patent without inner edema or erythema. Hematological/Lymphatic/Immunilogical: No  cervical lymphadenopathy. Cardiovascular: Normal rate, regular rhythm. Grossly normal heart sounds.  Good peripheral circulation. Respiratory: Normal respiratory effort without tachypnea nor retractions. Breath sounds are clear and equal bilaterally. No wheezes, rales, rhonchi. Musculoskeletal: Steady gait Neurologic:  Normal speech and language. Speech is normal. No gait instability.  Skin:  Skin is warm, dry.  Except: Anterior tip of nose and lower chin and left shoulder areas of honey colored crusting scabs with mild surrounding erythema, minimal tenderness, minimal localized edema, no active drainage, no abscess noted. Psychiatric: Mood and affect are normal. Speech and behavior are normal. Patient exhibits appropriate insight and judgment   ___________________________________________   LABS (all labs ordered are listed, but only abnormal results are displayed)  Labs Reviewed - No data to display  PROCEDURES Procedures   INITIAL IMPRESSION / ASSESSMENT AND PLAN / ED COURSE  Pertinent labs & imaging results that were available during my care of the patient were reviewed by me and considered in my medical decision making (see chart for details).  Well-appearing patient.  No acute distress.  Suspect impetigo rash.  Will treat with oral Bactrim and topical Bactroban.  Discussed wound cleaning, supportive care and monitoring.Discussed indication, risks and benefits of medications with patient.  Discussed follow up with Primary care physician this week. Discussed follow up and return parameters including no resolution or any worsening concerns. Patient verbalized understanding and agreed to plan.   ____________________________________________   FINAL CLINICAL IMPRESSION(S) / ED DIAGNOSES  Final diagnoses:  Impetigo     ED Discharge Orders         Ordered    sulfamethoxazole-trimethoprim (BACTRIM DS) 800-160 MG tablet  2 times daily     04/07/19 1853    mupirocin ointment  (BACTROBAN) 2 %     04/07/19 1853           Note: This dictation was prepared with Dragon dictation along with smaller phrase technology. Any transcriptional errors that result from this process are unintentional.         Marylene Land, NP 04/07/19 1933

## 2019-04-17 DIAGNOSIS — L270 Generalized skin eruption due to drugs and medicaments taken internally: Secondary | ICD-10-CM | POA: Diagnosis not present

## 2019-06-14 ENCOUNTER — Encounter: Payer: Self-pay | Admitting: Emergency Medicine

## 2019-06-14 ENCOUNTER — Ambulatory Visit (INDEPENDENT_AMBULATORY_CARE_PROVIDER_SITE_OTHER)
Admission: RE | Admit: 2019-06-14 | Discharge: 2019-06-14 | Disposition: A | Discharge status: Home / Self Care | Payer: 59 | Source: Ambulatory Visit

## 2019-06-14 ENCOUNTER — Ambulatory Visit
Admission: EM | Admit: 2019-06-14 | Discharge: 2019-06-14 | Disposition: A | Discharge status: Home / Self Care | Payer: 59 | Attending: Family Medicine | Admitting: Family Medicine

## 2019-06-14 ENCOUNTER — Other Ambulatory Visit: Payer: Self-pay

## 2019-06-14 DIAGNOSIS — I10 Essential (primary) hypertension: Secondary | ICD-10-CM | POA: Diagnosis not present

## 2019-06-14 DIAGNOSIS — L709 Acne, unspecified: Secondary | ICD-10-CM

## 2019-06-14 DIAGNOSIS — Z882 Allergy status to sulfonamides status: Secondary | ICD-10-CM | POA: Diagnosis not present

## 2019-06-14 DIAGNOSIS — R519 Headache, unspecified: Secondary | ICD-10-CM

## 2019-06-14 DIAGNOSIS — R21 Rash and other nonspecific skin eruption: Secondary | ICD-10-CM | POA: Diagnosis not present

## 2019-06-14 DIAGNOSIS — Z8661 Personal history of infections of the central nervous system: Secondary | ICD-10-CM

## 2019-06-14 DIAGNOSIS — Z20822 Contact with and (suspected) exposure to covid-19: Secondary | ICD-10-CM | POA: Diagnosis not present

## 2019-06-14 DIAGNOSIS — Z87891 Personal history of nicotine dependence: Secondary | ICD-10-CM | POA: Diagnosis not present

## 2019-06-14 NOTE — ED Provider Notes (Signed)
[facility] ____________________________________________  Time seen: Approximately 3:27 PM  I have reviewed the triage vital signs and the nursing notes.   HISTORY  Chief Complaint Rash (APPT) and Headache   HPI Penny Houston is a 34 y.o. female presenting for evaluation of headache.  Patient reports for the last 2 days she has had a moderate headache that she describes as "in the shape of a crown "on her head with a lot of pressure that is consistent with her previous meningitis headaches.  Patient has a history of recurrent meningitis, 1 bacterial and 3 viral second to herpes simplex virus.  Patient states her current headache feels consistent with the beginning of her meningitis headaches.  Does have photophobia.  Denies neck stiffness or back pain.  No fevers.  Patient expressed concern that she felt like she was beginning to have a rash to her chin like her previous impetigo just prior to her headache symptoms and felt like this may be causing her complaints.  States coming in as she felt that possibly starting an antibiotic may abort meningitis symptoms.  Patient has been using the Bactroban ointment left over which has helped some.  Denies cough, fevers, sore throat or recent sickness.  Denies chest pain or shortness of breath.  Denies sick contacts.  Reports otherwise doing well.   Past Medical History:  Diagnosis Date  . Hypertension   . Meningitis 01/24/2019  . 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 Mollaret's syndrome (benign recurrent meningitis)     Patient Active Problem List   Diagnosis Date Noted  . Mollaret's meningitis 01/26/2019  . Meningitis 01/24/2019  . Obesity 01/24/2019  . Essential hypertension 11/15/2017  . Hyperglycemia 01/18/2016  . Moderate episode of recurrent major depressive disorder ([place]) 01/18/2016  . 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 History of pre-eclampsia 12/28/2015  . Meningitis due  to herpes simplex virus 04/22/2015  . Nausea and vomiting 04/22/2015  . Acute headache 04/21/2015    Past Surgical History:  Procedure Laterality Date  . CESAREAN SECTION    . CHOLECYSTECTOMY       No current facility-administered medications for this encounter.  Current Outpatient Medications:  .  losartan (COZAAR) 25 MG tablet, Take by mouth., Disp: , Rfl:  .  mupirocin ointment (BACTROBAN) 2 %, Apply two times a day for 7 days., Disp: 22 g, Rfl: 0  Allergies Sulfa antibiotics  Family History  Problem Relation Age of Onset  . Rheum arthritis Mother   . Hypertension Mother   . Hypertension Father   . Hyperlipidemia Father   . Stroke Father   . Heart attack Father 9    Social History Social History   Tobacco Use  . Smoking status: Former Smoker    Packs/day: 0.50    Types: Cigarettes    Quit date: 10/2017    Years since quitting: 1.6  . Smokeless tobacco: Never Used  Substance Use Topics  . Alcohol use: No    Alcohol/week: 0.0 standard drinks  . Drug use: No    Review of Systems Constitutional: No fever Eyes: Positive photophobia. ENT: No sore throat. Cardiovascular: Denies chest pain. Respiratory: Denies shortness of breath. Gastrointestinal: No abdominal pain.  No nausea, no vomiting.  No diarrhea.   Genitourinary: Negative for dysuria. Musculoskeletal: Negative for back pain. Skin: Positive skin changes. Neurological: Positive headache.  Negative numbness, unilateral weakness or syncope.  ____________________________________________   PHYSICAL EXAM:  VITAL SIGNS: ED Triage Vitals  Enc Vitals Group     BP 06/14/19 1412 (!) 142/96     Pulse Rate 06/14/19 1412 77     Resp 06/14/19 1412 18     Temp 06/14/19 1412 98.7 F (37.1 C)     Temp Source 06/14/19 1412 Oral     SpO2 06/14/19 1412 97 %     Weight 06/14/19 1409 253 lb (114.8 kg)     Height 06/14/19 1409 5\' 9"  (1.753 m)     Head Circumference --      Peak Flow --      Pain Score  06/14/19 1408 8     Pain Loc --      Pain Edu? --      Excl. in [place]? --     Constitutional: Alert and oriented. Well appearing and in no acute distress. Eyes: Conjunctivae are normal. PERRL. EOMI. no pain with EOMs. ENT      Head: Normocephalic and atraumatic.  No sinus tenderness.      Nose: No congestion/rhinnorhea.      Mouth/Throat: Mucous membranes are moist.Oropharynx non-erythematous. Neck: No stridor. Supple without meningismus.  Hematological/Lymphatic/Immunilogical: No cervical lymphadenopathy. Cardiovascular: Normal heart rate. Respiratory: Normal respiratory effort without tachypnea nor retractions.  Musculoskeletal: Steady gait.  No midline cervical, thoracic or lumbar tenderness to palpation. Neurologic:  Normal speech and language. No gross focal neurologic deficits are appreciated. Speech is normal. No gait instability.  No meningismus. Skin:  Skin is warm, dry.  Except: Lower chin approximately 0.5 cm circular papule with slight honey colored crusts, no drainage, nontender, no further surrounding erythema. Psychiatric: Mood and affect are normal. Speech and behavior are normal. Patient exhibits appropriate insight and judgment   ___________________________________________   LABS (all labs ordered are listed, but only abnormal results are displayed)  Labs Reviewed - No data to display  PROCEDURES Procedures   INITIAL IMPRESSION / ASSESSMENT AND PLAN / ED COURSE  Pertinent labs & imaging results that were available during my care of the patient were reviewed by me and considered in my medical decision making (see chart for details).  Overall well-appearing patient.  Patient does have a small area of beginning impetigo to her chin, encouraged to continue Bactroban ointment.  No indication for oral antibiotic at this time.  However counseled regarding her headache complaints as she reports they are consistent with her previous recurrent meningitis recommend further  evaluation in the emergency room at this time.  Patient verbalized understanding states she will go directly to [facility] and her mother will drive her.  Stable at discharge. ____________________________________________   FINAL CLINICAL IMPRESSION(S) / ED DIAGNOSES  Final diagnoses:  Intractable headache, unspecified chronicity pattern, unspecified headache type  History of meningitis     ED Discharge Orders    None       Note: This dictation was prepared with Dragon dictation along with smaller phrase technology. Any transcriptional errors that result from this process are unintentional.         Marylene Land, NP 06/14/19 1542

## 2019-06-14 NOTE — Discharge Instructions (Addendum)
Go directly to Emergency room.  °

## 2019-06-14 NOTE — Discharge Instructions (Addendum)
Please seek in person evaluation at this time to ensure emergent meningitis treatment is not necessary.

## 2019-06-14 NOTE — ED Provider Notes (Signed)
Virtual Visit via Video Note:  Manufacturing systems engineer  initiated request for Telemedicine visit with [facility] team. I connected with Penny Houston  on 06/14/2019 at 11:59 AM  for a synchronized telemedicine visit using a video enabled HIPPA compliant telemedicine application. I verified that I am speaking with Penny Houston  using two identifiers. Zigmund Gottron, NP  was physically located in a [facility] Urgent care site and Penny Houston was located at a different location.   The limitations of evaluation and management by telemedicine as well as the availability of in-person appointments were discussed. Patient was informed that she  may incur a bill ( including co-pay) for this virtual visit encounter. Penny Houston  expressed understanding and gave verbal consent to proceed with virtual visit.     History of Present Illness:Penny Houston  is a 33 y.o. female presents with complaints of 1 week of "breakouts" to her face which are red and swollen. Similar a few months ago and had to be put on antibiotics. She is concerned these are infectious.  Now with headache also. History of recurrent meningitis. Last episodes last august, related to shingles exposure. Head feels a lot of pressure, similar to early onset of her previous episode of meningitis. Has had migraines also but states that this feels different than her migraine. Has had at least three previous episodes of meningitis. No neck pain, no fevers. No gi symptoms. Does have mild photophobia.   Past Medical History:  Diagnosis Date  . Hypertension   . Meningitis 01/24/2019  . 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 Mollaret's syndrome (benign recurrent meningitis)     No Known Allergies      Observations/Objective: Alert, oriented, non toxic in appearance. Clear coherent speech without difficulty. No increased work of breathing visualized.  At least two acne appearing lesions to chin noted without gross cellulitis or significant  abscess noted.   Assessment and Plan: Patient with history of meningitis, with headache she feels is similar to her previous episodes. I recommend that she seek in person evaluation at this time. Patient verbalized understanding and agreeable to plan.    Follow Up Instructions:    I discussed the assessment and treatment plan with the patient. The patient was provided an opportunity to ask questions and all were answered. The patient agreed with the plan and demonstrated an understanding of the instructions.   The patient was advised to call back or seek an in-person evaluation if the symptoms worsen or if the condition fails to improve as anticipated.  I provided 15 minutes of non-face-to-face time during this encounter.    Zigmund Gottron, NP  06/14/2019 11:59 AM         Zigmund Gottron, NP 06/14/19 [phone]

## 2019-06-14 NOTE — ED Triage Notes (Signed)
Pt c/o rash on her face. Located on her cheeks, and chin. She states that she has a headache. She states it is the same headache that she has had when she has had meningitis in the past. She states that she has had ti 4 times and knows this is the beginnings of what the headache feels like.

## 2019-09-29 DIAGNOSIS — Z8661 Personal history of infections of the central nervous system: Secondary | ICD-10-CM | POA: Diagnosis not present

## 2019-11-12 DIAGNOSIS — L400 Psoriasis vulgaris: Secondary | ICD-10-CM | POA: Diagnosis not present

## 2019-12-18 ENCOUNTER — Ambulatory Visit
Admission: EM | Admit: 2019-12-18 | Discharge: 2019-12-18 | Disposition: A | Discharge status: Home / Self Care | Payer: 59 | Attending: Family Medicine | Admitting: Family Medicine

## 2019-12-18 ENCOUNTER — Other Ambulatory Visit: Payer: Self-pay

## 2019-12-18 ENCOUNTER — Encounter: Payer: Self-pay | Admitting: Emergency Medicine

## 2019-12-18 DIAGNOSIS — R1030 Lower abdominal pain, unspecified: Secondary | ICD-10-CM

## 2019-12-18 DIAGNOSIS — M545 Low back pain, unspecified: Secondary | ICD-10-CM

## 2019-12-18 DIAGNOSIS — R310 Gross hematuria: Secondary | ICD-10-CM

## 2019-12-18 LAB — URINALYSIS, COMPLETE (UACMP) WITH MICROSCOPIC
Bilirubin Urine: NEGATIVE
Glucose, UA: 100 mg/dL — AB
Ketones, ur: NEGATIVE mg/dL
Leukocytes,Ua: NEGATIVE
Nitrite: POSITIVE — AB
Specific Gravity, Urine: >1.03 — ABNORMAL HIGH (ref 1.005–1.030)
pH: 5 (ref 5.0–8.0)

## 2019-12-18 MED ORDER — TAMSULOSIN HCL 0.4 MG PO CAPS: 0.4000 mg | ORAL_CAPSULE | Freq: Every day | ORAL | 0 refills | Status: AC

## 2019-12-18 MED ORDER — OXYCODONE-ACETAMINOPHEN 5-325 MG PO TABS: 1.0000 | ORAL_TABLET | Freq: Three times a day (TID) | ORAL | 0 refills | Status: DC | PRN

## 2019-12-18 NOTE — ED Provider Notes (Signed)
[facility]    CSN: [id number] Arrival date & time: 12/18/19  1807      History   Chief Complaint Chief Complaint  Patient presents with  . Hematuria  . Back Pain  . Abdominal Pain   HPI  34 year old female presents with the above complaints.  Symptoms started abruptly today.  She reports gross hematuria, low back pain, and lower abdominal pain.  No other reported urinary symptoms.  No fever.  Her pain is severe, 9/10 in severity.  No fever.  No relieving factors.  Patient is concerned that she may have a kidney stone.  No other complaints concerns at this time.  Past Medical History:  Diagnosis Date  . Hypertension   . Meningitis 01/24/2019  . 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 Mollaret's syndrome (benign recurrent meningitis)    Patient Active Problem List   Diagnosis Date Noted  . Mollaret's meningitis 01/26/2019  . Meningitis 01/24/2019  . Obesity 01/24/2019  . Essential hypertension 11/15/2017  . Hyperglycemia 01/18/2016  . 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 History of pre-eclampsia 12/28/2015  . 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 Nausea and vomiting 04/22/2015  . Acute headache 04/21/2015    Past Surgical History:  Procedure Laterality Date  . CESAREAN SECTION    . CHOLECYSTECTOMY      OB History   No obstetric history on file.      Home Medications    Prior to Admission medications   Medication Sig Start Date End Date Taking? Authorizing Provider  losartan (COZAAR) 25 MG tablet Take by mouth. 01/27/19 01/27/20 Yes [provider]  valACYclovir (VALTREX) 1000 MG tablet Take by mouth. 09/29/19 09/28/20 Yes [provider]  oxyCODONE-acetaminophen (PERCOCET/ROXICET) 5-325 MG tablet Take 1-2 tablets by mouth every 8 (eight) hours as needed for severe pain. 12/18/19   Coral Spikes,  DO  tamsulosin (FLOMAX) 0.4 MG CAPS capsule Take 1 capsule (0.4 mg total) by mouth daily. 12/18/19   Coral Spikes, DO  buPROPion (WELLBUTRIN XL) 150 MG 24 hr tablet Take by mouth. 02/10/16 04/07/19  [provider]    Family History Family History  Problem Relation Age of Onset  . Rheum arthritis Mother   . Hypertension Mother   . Hypertension Father   . Hyperlipidemia Father   . Stroke Father   . Heart attack Father 44    Social History Social History   Tobacco Use  . Smoking status: Former Smoker    Packs/day: 0.50    Types: Cigarettes    Quit date: 10/2017    Years since quitting: 2.2  . Smokeless tobacco: Never Used  Vaping Use  . Vaping Use: Never used  Substance Use Topics  . Alcohol use: No    Alcohol/week: 0.0 standard drinks  . Drug use: No     Allergies   Sulfa antibiotics   Review of Systems Review of Systems  Gastrointestinal: Positive for abdominal pain.  Genitourinary: Positive for hematuria.  Musculoskeletal: Positive for back pain.   Physical Exam Triage Vital Signs ED Triage Vitals  Enc Vitals Group     BP 12/18/19 1823 (!) 161/104     Pulse Rate 12/18/19 1823 75     Resp 12/18/19 1823 18     Temp 12/18/19 1823 98.3 F (36.8 C)     Temp Source 12/18/19 1823 Oral  SpO2 12/18/19 1823 100 %     Weight 12/18/19 1816 222 lb (100.7 kg)     Height 12/18/19 1816 5\' 9"  (1.753 m)     Head Circumference --      Peak Flow --      Pain Score 12/18/19 1815 9     Pain Loc --      Pain Edu? --      Excl. in [place]? --    Updated Vital Signs BP (!) 161/104 (BP Location: Right Arm)   Pulse 75   Temp 98.3 F (36.8 C) (Oral)   Resp 18   Ht 5\' 9"  (1.753 m)   Wt 100.7 kg   SpO2 100%   BMI 32.78 kg/m   Visual Acuity Right Eye Distance:   Left Eye Distance:   Bilateral Distance:    Right Eye Near:   Left Eye Near:    Bilateral Near:     Physical Exam Vitals and nursing note reviewed.  Constitutional:      General: She is not in  acute distress.    Appearance: Normal appearance. She is not ill-appearing.  HENT:     Head: Normocephalic and atraumatic.  Eyes:     General:        Right eye: No discharge.        Left eye: No discharge.     Conjunctiva/sclera: Conjunctivae normal.  Cardiovascular:     Rate and Rhythm: Normal rate and regular rhythm.     Heart sounds: No murmur heard.   Pulmonary:     Effort: Pulmonary effort is normal.     Breath sounds: No wheezing or rales.  Abdominal:     Palpations: Abdomen is soft.     Comments: Diffusely tender in the lower abdomen.  Neurological:     Mental Status: She is alert.  Psychiatric:        Mood and Affect: Mood normal.        Behavior: Behavior normal.    UC Treatments / Results  Labs (all labs ordered are listed, but only abnormal results are displayed) Labs Reviewed  URINALYSIS, COMPLETE (UACMP) WITH MICROSCOPIC - Abnormal; Notable for the following components:      Result Value   Color, Urine ORANGE (*)    APPearance HAZY (*)    Specific Gravity, Urine >1.030 (*)    Glucose, UA 100 (*)    Hgb urine dipstick SMALL (*)    Protein, ur TRACE (*)    Nitrite POSITIVE (*)    Bacteria, UA FEW (*)    All other components within normal limits  URINE CULTURE    EKG   Radiology No results found.  Procedures Procedures (including critical care time)  Medications Ordered in UC Medications - No data to display  Initial Impression / Assessment and Plan / UC Course  I have reviewed the triage vital signs and the nursing notes.  Pertinent labs & imaging results that were available during my care of the patient were reviewed by me and considered in my medical decision making (see chart for details).    34 year old female presents with reports of gross hematuria, bilateral back pain as well as lower abdominal pain.  Patient's pain is severe.  Patient has taken Azo.  This limits her urinalysis.  Microscopy revealed mucus and some casts but no pyuria or  hematuria.  Sending culture.  Treating empirically for possible kidney stone with fluids, Flomax, urine strainer, and Percocet.  Advised that she needs  to go to the ER if she fails improve or worsens.  Final Clinical Impressions(s) / UC Diagnoses   Final diagnoses:  Gross hematuria  Acute bilateral low back pain without sciatica  Lower abdominal pain     Discharge Instructions     Lots of fluids.  Medications as directed.  If you worsen or fail to improve, go to the ER.  Take care  Dr. Lacinda Axon    ED Prescriptions    Medication Sig Dispense Auth. Provider   oxyCODONE-acetaminophen (PERCOCET/ROXICET) 5-325 MG tablet Take 1-2 tablets by mouth every 8 (eight) hours as needed for severe pain. 10 tablet Maressa Apollo G, DO   tamsulosin (FLOMAX) 0.4 MG CAPS capsule Take 1 capsule (0.4 mg total) by mouth daily. 14 capsule Thersa Salt G, DO     I have reviewed the PDMP during this encounter.   Coral Spikes, [place] 12/18/19 1941

## 2019-12-18 NOTE — Discharge Instructions (Signed)
Lots of fluids.  Medications as directed.  If you worsen or fail to improve, go to the ER.  Take care  Dr. Lacinda Axon

## 2019-12-18 NOTE — ED Triage Notes (Signed)
Patient c/o hematuria, back pain and abdominal pain that started today.

## 2019-12-20 DIAGNOSIS — R102 Pelvic and perineal pain: Secondary | ICD-10-CM | POA: Diagnosis not present

## 2019-12-20 DIAGNOSIS — M545 Low back pain: Secondary | ICD-10-CM | POA: Diagnosis not present

## 2019-12-20 DIAGNOSIS — N200 Calculus of kidney: Secondary | ICD-10-CM | POA: Diagnosis not present

## 2019-12-20 DIAGNOSIS — N8500 Endometrial hyperplasia, unspecified: Secondary | ICD-10-CM | POA: Diagnosis not present

## 2019-12-20 DIAGNOSIS — N925 Other specified irregular menstruation: Secondary | ICD-10-CM | POA: Diagnosis not present

## 2019-12-20 DIAGNOSIS — N83201 Unspecified ovarian cyst, right side: Secondary | ICD-10-CM | POA: Diagnosis not present

## 2019-12-20 DIAGNOSIS — I10 Essential (primary) hypertension: Secondary | ICD-10-CM | POA: Diagnosis not present

## 2019-12-20 DIAGNOSIS — N83202 Unspecified ovarian cyst, left side: Secondary | ICD-10-CM | POA: Diagnosis not present

## 2019-12-20 DIAGNOSIS — N739 Female pelvic inflammatory disease, unspecified: Secondary | ICD-10-CM | POA: Diagnosis not present

## 2019-12-20 DIAGNOSIS — N921 Excessive and frequent menstruation with irregular cycle: Secondary | ICD-10-CM | POA: Diagnosis not present

## 2019-12-20 DIAGNOSIS — Z87891 Personal history of nicotine dependence: Secondary | ICD-10-CM | POA: Diagnosis not present

## 2019-12-20 LAB — URINE CULTURE: Culture: <10000 — AB

## 2019-12-23 DIAGNOSIS — Z124 Encounter for screening for malignant neoplasm of cervix: Secondary | ICD-10-CM | POA: Diagnosis not present

## 2019-12-23 DIAGNOSIS — N912 Amenorrhea, unspecified: Secondary | ICD-10-CM | POA: Diagnosis not present

## 2019-12-23 DIAGNOSIS — N939 Abnormal uterine and vaginal bleeding, unspecified: Secondary | ICD-10-CM | POA: Diagnosis not present

## 2020-02-12 DIAGNOSIS — Z23 Encounter for immunization: Secondary | ICD-10-CM | POA: Diagnosis not present

## 2020-03-04 DIAGNOSIS — Z23 Encounter for immunization: Secondary | ICD-10-CM | POA: Diagnosis not present

## 2020-06-29 DIAGNOSIS — Z202 Contact with and (suspected) exposure to infections with a predominantly sexual mode of transmission: Secondary | ICD-10-CM | POA: Diagnosis not present

## 2020-10-12 ENCOUNTER — Encounter: Payer: Self-pay | Admitting: Emergency Medicine

## 2020-10-12 ENCOUNTER — Ambulatory Visit
Admission: EM | Admit: 2020-10-12 | Discharge: 2020-10-12 | Disposition: A | Discharge status: Home / Self Care | Payer: Self-pay | Attending: Family Medicine | Admitting: Family Medicine

## 2020-10-12 DIAGNOSIS — N898 Other specified noninflammatory disorders of vagina: Secondary | ICD-10-CM | POA: Insufficient documentation

## 2020-10-12 DIAGNOSIS — J020 Streptococcal pharyngitis: Secondary | ICD-10-CM | POA: Insufficient documentation

## 2020-10-12 LAB — WET PREP, GENITAL
Clue Cells Wet Prep HPF POC: NONE SEEN
Sperm: NONE SEEN
Trich, Wet Prep: NONE SEEN
Yeast Wet Prep HPF POC: NONE SEEN

## 2020-10-12 LAB — CHLAMYDIA/NGC RT PCR (ARMC ONLY)
Chlamydia Tr: NOT DETECTED
N gonorrhoeae: NOT DETECTED

## 2020-10-12 LAB — GROUP A STREP BY PCR: Group A Strep by PCR: DETECTED — AB

## 2020-10-12 MED ORDER — FLUCONAZOLE 150 MG PO TABS: 150.0000 mg | ORAL_TABLET | Freq: Once | ORAL | 0 refills | Status: AC

## 2020-10-12 MED ORDER — AMOXICILLIN 500 MG PO TABS: 500.0000 mg | ORAL_TABLET | Freq: Two times a day (BID) | ORAL | 0 refills | Status: AC

## 2020-10-12 NOTE — ED Triage Notes (Signed)
Patient c/o sore throat x 2 days.  She is also having vaginal discharge that started on Friday. She was recently treated for BV.

## 2020-10-12 NOTE — ED Provider Notes (Signed)
[facility]    CSN: [id number] Arrival date & time: 10/12/20  0946      History   Chief Complaint Chief Complaint  Patient presents with  . Sore Throat  . Vaginal Discharge   HPI  35 year old female presents with the above complaints.  Sore throat x2 days.  Severe.  Patient is concerned that she has strep throat.  Pain is 7/10 in severity.  No relieving factors.  Patient also notes that she has recently developed vaginal discharge again.  She was recently treated for bacterial vaginosis via a telehealth encounter.  Past Medical History:  Diagnosis Date  . Hypertension   . Meningitis 01/24/2019  . 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 Mollaret's syndrome (benign recurrent meningitis)     Patient Active Problem List   Diagnosis Date Noted  . Mollaret's meningitis 01/26/2019  . Meningitis 01/24/2019  . Obesity 01/24/2019  . Essential hypertension 11/15/2017  . Hyperglycemia 01/18/2016  . 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 History of pre-eclampsia 12/28/2015  . 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 Nausea and vomiting 04/22/2015  . Acute headache 04/21/2015    Past Surgical History:  Procedure Laterality Date  . CESAREAN SECTION    . CHOLECYSTECTOMY      OB History   No obstetric history on file.      Home Medications    Prior to Admission medications   Medication Sig Start Date End Date Taking? Authorizing Provider  amoxicillin (AMOXIL) 500 MG tablet Take 1 tablet (500 mg total) by mouth 2 (two) times daily. 10/12/20  Yes Azhane Eckart G, DO  fluconazole (DIFLUCAN) 150 MG tablet Take 1 tablet (150 mg total) by mouth once for 1 dose. Repeat dose in 72 hours. 10/12/20 10/12/20 Yes Azir Muzyka G, DO  losartan (COZAAR) 25 MG tablet Take by mouth. 01/27/19 10/12/20 Yes [provider]  oxyCODONE-acetaminophen  (PERCOCET/ROXICET) 5-325 MG tablet Take 1-2 tablets by mouth every 8 (eight) hours as needed for severe pain. 12/18/19   Coral Spikes, DO  tamsulosin (FLOMAX) 0.4 MG CAPS capsule Take 1 capsule (0.4 mg total) by mouth daily. 12/18/19   Coral Spikes, DO  buPROPion (WELLBUTRIN XL) 150 MG 24 hr tablet Take by mouth. 02/10/16 04/07/19  [provider]    Family History Family History  Problem Relation Age of Onset  . Rheum arthritis Mother   . Hypertension Mother   . Hypertension Father   . Hyperlipidemia Father   . Stroke Father   . Heart attack Father 16    Social History Social History   Tobacco Use  . Smoking status: Former Smoker    Packs/day: 0.50    Types: Cigarettes    Quit date: 10/2017    Years since quitting: 3.0  . Smokeless tobacco: Never Used  Vaping Use  . Vaping Use: Never used  Substance Use Topics  . Alcohol use: No    Alcohol/week: 0.0 standard drinks  . Drug use: No     Allergies   Sulfa antibiotics   Review of Systems Review of Systems  HENT: Positive for sore throat.   Genitourinary: Positive for vaginal discharge.   Physical Exam Triage Vital Signs ED Triage Vitals [10/12/20 1053]  Enc Vitals Group     BP (!) 171/91     Pulse Rate 78     Resp 18  Temp 98.9 F (37.2 C)     Temp Source Oral     SpO2 99 %     Weight 240 lb (108.9 kg)     Height 5\' 10"  (1.778 m)     Head Circumference      Peak Flow      Pain Score 7     Pain Loc      Pain Edu?      Excl. in [place]?    Updated Vital Signs BP (!) 171/91 (BP Location: Right Arm)   Pulse 78   Temp 98.9 F (37.2 C) (Oral)   Resp 18   Ht 5\' 10"  (1.778 m)   Wt 108.9 kg   SpO2 99%   BMI 34.44 kg/m   Visual Acuity Right Eye Distance:   Left Eye Distance:   Bilateral Distance:    Right Eye Near:   Left Eye Near:    Bilateral Near:     Physical Exam Vitals and nursing note reviewed.  Constitutional:      General: She is not in acute distress.    Appearance: Normal  appearance. She is obese. She is not ill-appearing.  HENT:     Head: Normocephalic and atraumatic.     Mouth/Throat:     Pharynx: Oropharyngeal exudate and posterior oropharyngeal erythema present.  Eyes:     General:        Right eye: No discharge.        Left eye: No discharge.     Conjunctiva/sclera: Conjunctivae normal.  Cardiovascular:     Rate and Rhythm: Normal rate and regular rhythm.  Pulmonary:     Effort: Pulmonary effort is normal.     Breath sounds: Normal breath sounds. No wheezing, rhonchi or rales.  Neurological:     Mental Status: She is alert.  Psychiatric:        Mood and Affect: Mood normal.        Behavior: Behavior normal.    UC Treatments / Results  Labs (all labs ordered are listed, but only abnormal results are displayed) Labs Reviewed  GROUP A STREP BY PCR - Abnormal; Notable for the following components:      Result Value   Group A Strep by PCR DETECTED (*)    All other components within normal limits  WET PREP, GENITAL - Abnormal; Notable for the following components:   WBC, Wet Prep HPF POC TOO NUMEROUS TO COUNT (*)    All other components within normal limits  CHLAMYDIA/NGC RT PCR [facility] ONLY)    EKG   Radiology No results found.  Procedures Procedures (including critical care time)  Medications Ordered in UC Medications - No data to display  Initial Impression / Assessment and Plan / UC Course  I have reviewed the triage vital signs and the nursing notes.  Pertinent labs & imaging results that were available during my care of the patient were reviewed by me and considered in my medical decision making (see chart for details).    35 year old female presents with strep pharyngitis.  Wet prep revealed white blood cells but no evidence of yeast, trichomoniasis, or BV.  Diflucan if needed for yeast vaginitis with antibiotic therapy.  Treating strep pharyngitis with amoxicillin.  Awaiting STD test results.  Final Clinical  Impressions(s) / UC Diagnoses   Final diagnoses:  Strep pharyngitis  Vaginal discharge   Discharge Instructions   None    ED Prescriptions    Medication Sig Dispense Auth. Provider  amoxicillin (AMOXIL) 500 MG tablet Take 1 tablet (500 mg total) by mouth 2 (two) times daily. 20 tablet Deserae Jennings G, DO   fluconazole (DIFLUCAN) 150 MG tablet Take 1 tablet (150 mg total) by mouth once for 1 dose. Repeat dose in 72 hours. 2 tablet Coral Spikes, DO     PDMP not reviewed this encounter.   Coral Spikes, [place] 10/12/20 1321

## 2020-10-20 ENCOUNTER — Other Ambulatory Visit: Payer: Self-pay

## 2020-10-20 MED ORDER — VALACYCLOVIR HCL 1 G PO TABS
ORAL_TABLET | ORAL | 0 refills | Status: DC
  Filled 2020-10-20: qty 30, 30d supply, fill #0

## 2020-10-22 ENCOUNTER — Other Ambulatory Visit: Payer: Self-pay

## 2021-01-18 ENCOUNTER — Other Ambulatory Visit: Payer: Self-pay

## 2021-02-27 ENCOUNTER — Other Ambulatory Visit: Payer: Self-pay

## 2021-02-28 ENCOUNTER — Other Ambulatory Visit: Payer: Self-pay

## 2021-02-28 MED ORDER — VALACYCLOVIR HCL 1 G PO TABS
ORAL_TABLET | ORAL | 0 refills | Status: AC
  Filled 2021-02-28: qty 30, 30d supply, fill #0

## 2021-03-11 ENCOUNTER — Other Ambulatory Visit: Payer: Self-pay

## 2021-12-07 ENCOUNTER — Other Ambulatory Visit: Payer: Self-pay

## 2021-12-07 MED ORDER — VALACYCLOVIR HCL 1 G PO TABS
ORAL_TABLET | ORAL | 0 refills | Status: AC
  Filled 2021-12-07: qty 15, 15d supply, fill #0

## 2021-12-27 ENCOUNTER — Other Ambulatory Visit: Payer: Self-pay

## 2022-02-11 ENCOUNTER — Emergency Department (HOSPITAL_BASED_OUTPATIENT_CLINIC_OR_DEPARTMENT_OTHER)
Admission: EM | Admit: 2022-02-11 | Discharge: 2022-02-11 | Disposition: A | Discharge status: Home / Self Care | Payer: Self-pay | Attending: Emergency Medicine | Admitting: Emergency Medicine

## 2022-02-11 ENCOUNTER — Other Ambulatory Visit: Payer: Self-pay

## 2022-02-11 DIAGNOSIS — I10 Essential (primary) hypertension: Secondary | ICD-10-CM | POA: Insufficient documentation

## 2022-02-11 DIAGNOSIS — K0889 Other specified disorders of teeth and supporting structures: Secondary | ICD-10-CM | POA: Insufficient documentation

## 2022-02-11 DIAGNOSIS — Z79899 Other long term (current) drug therapy: Secondary | ICD-10-CM | POA: Insufficient documentation

## 2022-02-11 MED ORDER — CLINDAMYCIN HCL 150 MG PO CAPS
300.0000 mg | ORAL_CAPSULE | Freq: Once | ORAL | Status: AC
  Administered 2022-02-11: 300 mg via ORAL
  Filled 2022-02-11: qty 2

## 2022-02-11 MED ORDER — CLINDAMYCIN HCL 300 MG PO CAPS
300.0000 mg | ORAL_CAPSULE | Freq: Four times a day (QID) | ORAL | 0 refills | Status: DC
  Filled 2022-02-11: qty 28, 7d supply, fill #0

## 2022-02-11 MED ORDER — CLINDAMYCIN HCL 300 MG PO CAPS: 300.0000 mg | ORAL_CAPSULE | Freq: Four times a day (QID) | ORAL | 0 refills | Status: AC

## 2022-02-11 MED ORDER — OXYCODONE-ACETAMINOPHEN 5-325 MG PO TABS
1.0000 | ORAL_TABLET | Freq: Four times a day (QID) | ORAL | 0 refills | Status: DC | PRN
  Filled 2022-02-11: qty 12, 2d supply, fill #0

## 2022-02-11 MED ORDER — OXYCODONE-ACETAMINOPHEN 5-325 MG PO TABS: 1.0000 | ORAL_TABLET | Freq: Four times a day (QID) | ORAL | 0 refills | Status: AC | PRN

## 2022-02-11 NOTE — ED Provider Notes (Signed)
[facility] Provider Note   CSN: [id number] Arrival date & time: 02/11/22  0046     History  Chief Complaint  Patient presents with   Dental Pain    Penny Houston is a 36 y.o. female.  Patient is a 36 year old female with past medical history of hypertension and recent dental extraction.  She presents today with complaints of swelling and pain at the gumline of her left lower molars.  This has been worsening over the past few days.  She called her dentist today and has an appointment to see them this coming Thursday, but pain is worsening.  She denies any difficulty breathing or swallowing.  Pain is worse with hot and cold and when she tries to chew.  The history is provided by the patient.       Home Medications Prior to Admission medications   Medication Sig Start Date End Date Taking? Authorizing Provider  amoxicillin (AMOXIL) 500 MG tablet Take 1 tablet (500 mg total) by mouth 2 (two) times daily. 10/12/20   Coral Spikes, DO  losartan (COZAAR) 25 MG tablet Take by mouth. 01/27/19 10/12/20  [provider]  oxyCODONE-acetaminophen (PERCOCET/ROXICET) 5-325 MG tablet Take 1-2 tablets by mouth every 8 (eight) hours as needed for severe pain. 12/18/19   Coral Spikes, DO  tamsulosin (FLOMAX) 0.4 MG CAPS capsule Take 1 capsule (0.4 mg total) by mouth daily. 12/18/19   Coral Spikes, DO  valACYclovir (VALTREX) 1000 MG tablet Take 1 tablet (1,000 mg total) by mouth once daily 02/28/21     valACYclovir (VALTREX) 1000 MG tablet Take 1 tablet (1,000 mg total) by mouth once daily 12/07/21     buPROPion (WELLBUTRIN XL) 150 MG 24 hr tablet Take by mouth. 02/10/16 04/07/19  [provider]      Allergies    Sulfa antibiotics    Review of Systems   Review of Systems  All other systems reviewed and are negative.   Physical Exam Updated Vital Signs BP (!) 188/101   Pulse 71   Temp 98.2 F (36.8 C) (Oral)   Resp 18   Ht '5\' 9"'$  (1.753 m)   Wt 117.9  kg   SpO2 98%   BMI 38.40 kg/m  Physical Exam Vitals and nursing note reviewed.  Constitutional:      General: She is not in acute distress.    Appearance: Normal appearance. She is not ill-appearing.  HENT:     Head: Normocephalic and atraumatic.     Mouth/Throat:     Comments: There is gingival inflammation adjacent to the left lower first and second molars.  There are small pustules which may be the beginnings of an abscess. Pulmonary:     Effort: Pulmonary effort is normal.  Skin:    General: Skin is warm and dry.  Neurological:     Mental Status: She is alert.     ED Results / Procedures / Treatments   Labs (all labs ordered are listed, but only abnormal results are displayed) Labs Reviewed - No data to display  EKG None  Radiology No results found.  Procedures Procedures    Medications Ordered in ED Medications  clindamycin (CLEOCIN) capsule 300 mg (has no administration in time range)    ED Course/ Medical Decision Making/ A&P  Patient presenting with dental pain/infection as prescribed in the HPI.  Patient to be prescribed antibiotics and pain medicine.  She is to follow-up with her dentist next week.  Final Clinical  Impression(s) / ED Diagnoses Final diagnoses:  None    Rx / DC Orders ED Discharge Orders     None         Veryl Speak, MD 02/11/22 [phone]

## 2022-02-11 NOTE — Discharge Instructions (Addendum)
Begin taking clindamycin as prescribed.  Again taking Percocet as prescribed as needed for pain.  Follow-up with your dentist next week.

## 2022-02-11 NOTE — ED Triage Notes (Signed)
Patient coming to ED for evaluation of dental pain and "gum abscess" to L lower jaw/molar.  States "it chipped last week."  No reports of fever

## 2022-02-12 ENCOUNTER — Other Ambulatory Visit: Payer: Self-pay
